# Patient Record
Sex: Male | Born: 2000 | Race: White | Hispanic: No | Marital: Single | Smoking: Never smoker
Health system: Southern US, Community
[De-identification: ages and names within clinical notes are randomized; demographics above are authoritative.]

## PROBLEM LIST (undated history)

## (undated) DIAGNOSIS — R55 Syncope and collapse: Secondary | ICD-10-CM

## (undated) DIAGNOSIS — F419 Anxiety disorder, unspecified: Secondary | ICD-10-CM

## (undated) DIAGNOSIS — I471 Supraventricular tachycardia: Secondary | ICD-10-CM

## (undated) DIAGNOSIS — F909 Attention-deficit hyperactivity disorder, unspecified type: Secondary | ICD-10-CM

## (undated) HISTORY — DX: Attention-deficit hyperactivity disorder, unspecified type: F90.9

## (undated) HISTORY — DX: Supraventricular tachycardia: I47.1

## (undated) HISTORY — PX: NO PAST SURGERIES: SHX2092

## (undated) HISTORY — DX: Syncope and collapse: R55

## (undated) HISTORY — DX: Anxiety disorder, unspecified: F41.9

---
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
## 2018-10-18 ENCOUNTER — Ambulatory Visit: Payer: Self-pay | Admitting: Cardiology

## 2018-11-12 ENCOUNTER — Ambulatory Visit: Payer: BC Managed Care – PPO | Admitting: Cardiology

## 2018-11-12 ENCOUNTER — Encounter: Payer: Self-pay | Admitting: Cardiology

## 2018-11-12 ENCOUNTER — Other Ambulatory Visit: Payer: Self-pay

## 2018-11-12 ENCOUNTER — Ambulatory Visit: Payer: BC Managed Care – PPO

## 2018-11-12 VITALS — BP 122/75 | HR 97 | Temp 97.3°F | Ht 74.0 in | Wt 155.5 lb

## 2018-11-12 DIAGNOSIS — R12 Heartburn: Secondary | ICD-10-CM | POA: Diagnosis not present

## 2018-11-12 DIAGNOSIS — R079 Chest pain, unspecified: Secondary | ICD-10-CM | POA: Insufficient documentation

## 2018-11-12 DIAGNOSIS — R55 Syncope and collapse: Secondary | ICD-10-CM | POA: Diagnosis not present

## 2018-11-12 DIAGNOSIS — R0789 Other chest pain: Secondary | ICD-10-CM | POA: Diagnosis not present

## 2018-11-12 DIAGNOSIS — R002 Palpitations: Secondary | ICD-10-CM | POA: Diagnosis not present

## 2018-11-12 NOTE — Progress Notes (Signed)
Subjective:  Primary Physician:  Eartha InchBadger, Michael C, MD  Patient ID: Cody Pacheco, male    DOB: 09/17/2000, 18 y.o.   MRN: [id number]  Chief Complaint  Patient presents with  . Near Syncope  . New Patient (Initial Visit)    HPI: Cody BeckwithSpencer Pacheco  is a 18 y.o. male. Patient complains of feeling burning type pain in the epigastrium and lower substernal region after he plays basketball or other heavy exertion like weight lifting etc.  The pain lasts for a few minutes but subsequently he has nausea for one to 2 days. Patient has had these symptoms for many years but frequency has increased recently.  There is no radiation of the pain to the arm,  neck or back of the chest.   After about one hour of these symptoms, he starts feeling dizziness and has feeling of near syncope off and on.  Patient says that at that time he is usually not doing anything.  He had passed out once for few seconds when he was walking out of a restaurant.  He has felt skipping in the heart occasionally.  No history of any spells of sudden fast heartbeat or irregular heartbeat.  Patient gives history of heartburn after eating greasy foods etc. He denies acid reflux. He takes Tums as needed.  No complaints of shortness of breath, orthopnea or PND.  No history of swelling on the legs.  No history of diabetes mellitus, hypertension.  His cholesterol has never been checked.  He does not smoke. His grandfather had an MI in his 7260s.  There is no family history of sudden cardiac death. He usually drinks one can of soda and occasionally tea.  No history of taking decongestants.  Patient has history of ADD and has been on amphetamine therapy since childhood.  No history of thyroid problems.  Past Medical History:  Diagnosis Date  . Near syncope     Past Surgical History:  Procedure Laterality Date  . None      Social History   Socioeconomic History  . Marital status: Single    Spouse name: Not on file  . Number of  children: 0  . Years of education: Not on file  . Highest education level: Not on file  Occupational History  . Not on file  Social Needs  . Financial resource strain: Not on file  . Food insecurity    Worry: Not on file    Inability: Not on file  . Transportation needs    Medical: Not on file    Non-medical: Not on file  Tobacco Use  . Smoking status: Never Smoker  . Smokeless tobacco: Never Used  . Tobacco comment: marijuana  Substance and Sexual Activity  . Alcohol use: Not Currently  . Drug use: Not on file  . Sexual activity: Not on file  Lifestyle  . Physical activity    Days per week: Not on file    Minutes per session: Not on file  . Stress: Not on file  Relationships  . Social Musicianconnections    Talks on phone: Not on file    Gets together: Not on file    Attends religious service: Not on file    Active member of club or organization: Not on file    Attends meetings of clubs or organizations: Not on file    Relationship status: Not on file  . Intimate partner violence    Fear of current or ex partner: Not on file  Emotionally abused: Not on file    Physically abused: Not on file    Forced sexual activity: Not on file  Other Topics Concern  . Not on file  Social History Narrative  . Not on file    Current Outpatient Medications on File Prior to Visit  Medication Sig Dispense Refill  . MYDAYIS 37.5 MG CP24 daily.     No current facility-administered medications on file prior to visit.     Review of Systems  Constitutional: Negative for fever.  HENT: Negative for nosebleeds.   Eyes: Negative for blurred vision.  Respiratory: Negative for cough and shortness of breath.   Cardiovascular: Positive for chest pain (heartburn like pain) and palpitations. Negative for orthopnea and leg swelling.  Gastrointestinal: Positive for heartburn and nausea. Negative for abdominal pain and vomiting.  Genitourinary: Negative for dysuria.  Musculoskeletal: Negative for  myalgias.  Skin: Negative for itching and rash.  Neurological: Positive for dizziness (and near syncope). Negative for seizures and loss of consciousness.  Psychiatric/Behavioral: The patient is not nervous/anxious.        Objective:  Blood pressure 122/75, pulse 97, temperature (!) 97.3 F (36.3 C), height 6\' 2"  (1.88 m), weight 155 lb 8 oz (70.5 kg), SpO2 100 %. Body mass index is 19.97 kg/m. No significant orthostatic blood pressure changes.  Physical Exam  Constitutional: He is oriented to person, place, and time. He appears well-developed and well-nourished.  HENT:  Head: Normocephalic and atraumatic.  Eyes: Conjunctivae are normal.  Neck: No thyromegaly present.  Cardiovascular: Normal rate and regular rhythm. Exam reveals no gallop.  No murmur heard. Pulses:      Carotid pulses are 2+ on the right side and 2+ on the left side.      Femoral pulses are 2+ on the right side and 2+ on the left side.      Dorsalis pedis pulses are 2+ on the right side and 2+ on the left side.       Posterior tibial pulses are 2+ on the right side and 2+ on the left side.  Pulmonary/Chest: He has no wheezes. He has no rales.  Abdominal: He exhibits no mass. There is no abdominal tenderness.  Musculoskeletal:        General: No edema.  Lymphadenopathy:    He has no cervical adenopathy.  Neurological: He is alert and oriented to person, place, and time.  Skin: Skin is warm.   CARDIAC STUDIES:   Assessment & Recommendations:   1. Near syncope - EKG 12-Lead- Sinus  Rhythm  WITHIN NORMAL LIMITS. Normal QTc  2. Chest pain of uncertain etiology  3. Palpitation  4. Heartburn  Laboratory Exam:  No flowsheet data found. No flowsheet data found. Lipid Panel  No results found for: CHOL, TRIG, HDL, CHOLHDL, VLDL, LDLCALC, LDLDIRECT   Recommendation:  I have discussed my assessment with the patient.  He has symptoms of burning type pain in the epigastrium and lower substernal region on  exertion.  Subsequently he gets nausea (usually after 1 hour or more), he has dizziness and near syncope. The dizziness and near syncope happens at rest.  Exact etiology of these symptoms is unclear.  We will do cardiac event monitoring to assess for arrhythmia.  He will have echocardiogram to assess for any structural heart disease and treadmill stress test to check for exercise induced arrhythmias.  I have advised him to take Prilosec 20 mg daily for the symptoms of heartburn.  He will return for follow-up  after the above tests.  Despina Hick, MD, [facility] 11/12/2018, 12:37 PM [facility]. [place] Pager: [phone] Office: [phone] If no answer Cell [phone]

## 2018-11-14 ENCOUNTER — Encounter: Payer: Self-pay | Admitting: Cardiology

## 2018-11-14 NOTE — Progress Notes (Signed)
Serious event report 11/12/2018: SVT at 215 bpm lasting for 1 min. No reported symptoms. Will continue to monitor.

## 2018-11-27 ENCOUNTER — Other Ambulatory Visit: Payer: Self-pay

## 2018-11-27 ENCOUNTER — Ambulatory Visit (INDEPENDENT_AMBULATORY_CARE_PROVIDER_SITE_OTHER): Payer: BC Managed Care – PPO

## 2018-11-27 DIAGNOSIS — R0789 Other chest pain: Secondary | ICD-10-CM

## 2018-12-02 ENCOUNTER — Ambulatory Visit (INDEPENDENT_AMBULATORY_CARE_PROVIDER_SITE_OTHER): Payer: BC Managed Care – PPO

## 2018-12-02 DIAGNOSIS — R55 Syncope and collapse: Secondary | ICD-10-CM

## 2019-01-10 ENCOUNTER — Encounter: Payer: Self-pay | Admitting: Cardiology

## 2019-01-13 ENCOUNTER — Other Ambulatory Visit: Payer: Self-pay

## 2019-01-13 ENCOUNTER — Encounter: Payer: Self-pay | Admitting: Cardiology

## 2019-01-13 ENCOUNTER — Telehealth: Payer: Self-pay

## 2019-01-13 ENCOUNTER — Ambulatory Visit (INDEPENDENT_AMBULATORY_CARE_PROVIDER_SITE_OTHER): Payer: BC Managed Care – PPO | Admitting: Cardiology

## 2019-01-13 VITALS — BP 142/82 | HR 64 | Temp 96.0°F | Ht 74.0 in | Wt 158.0 lb

## 2019-01-13 DIAGNOSIS — I471 Supraventricular tachycardia: Secondary | ICD-10-CM | POA: Diagnosis not present

## 2019-01-13 DIAGNOSIS — R110 Nausea: Secondary | ICD-10-CM

## 2019-01-13 DIAGNOSIS — R55 Syncope and collapse: Secondary | ICD-10-CM

## 2019-01-13 NOTE — Telephone Encounter (Signed)
Pt's mother called [phone] I told her you would call her after office hours.

## 2019-01-13 NOTE — Progress Notes (Signed)
Primary Physician:  Eartha Inch, MD   Patient ID: Cody Pacheco, male    DOB: 30-Oct-2000, 18 y.o.   MRN: [id number]  Subjective:    Chief Complaint  Patient presents with  . Loss of Consciousness  . Follow-up    2 month  . Results    monitor    HPI: Cody Pacheco  is a 18 y.o. male  with history of ADD, recently evaluated by Dr. Sherril Croon for evaluation of palpitations, dizziness, and exertional dizziness. He underwent 30 day event monitor, echocardiogram, and treadmill stress test and now presents for follow up.  Patient has had symptoms for several years; however, due to increased frequency of his symptoms prompted evaluation by [place]. Patient reports symptoms of nausea after heavy exertion that is now happening 2-3 times a week. No vomiting, he previously had indigestion symptoms, but he states that this improved with PPI.   He has occasional episodes of sudden dizziness at rest, approximately 2 times a month. He occasionally notices palpitations with this, but not all the time. Has the feeling that he may pass out. Generally can last for a few minutes. He does report one episode of syncope that occurred while walking out of a restaurant. LOC was only for a few seconds.   No complaints of shortness of breath, orthopnea or PND.  No history of swelling on the legs.  No history of diabetes mellitus, hypertension.  His cholesterol has never been checked.  He does not smoke. His grandfather had an MI in his 7s.  There is no family history of sudden cardiac death. He usually drinks one can of soda and occasionally tea.  No history of taking decongestants.  Patient has history of ADD and has been on amphetamine therapy since childhood.  No history of thyroid problems  Past Medical History:  Diagnosis Date  . Near syncope     Past Surgical History:  Procedure Laterality Date  . None      Social History   Socioeconomic History  . Marital status: Single    Spouse name: Not  on file  . Number of children: 0  . Years of education: Not on file  . Highest education level: Not on file  Occupational History  . Not on file  Social Needs  . Financial resource strain: Not on file  . Food insecurity    Worry: Not on file    Inability: Not on file  . Transportation needs    Medical: Not on file    Non-medical: Not on file  Tobacco Use  . Smoking status: Never Smoker  . Smokeless tobacco: Never Used  . Tobacco comment: marijuana  Substance and Sexual Activity  . Alcohol use: Not Currently  . Drug use: Not Currently  . Sexual activity: Not on file  Lifestyle  . Physical activity    Days per week: Not on file    Minutes per session: Not on file  . Stress: Not on file  Relationships  . Social Musician on phone: Not on file    Gets together: Not on file    Attends religious service: Not on file    Active member of club or organization: Not on file    Attends meetings of clubs or organizations: Not on file    Relationship status: Not on file  . Intimate partner violence    Fear of current or ex partner: Not on file    Emotionally abused: Not on  file    Physically abused: Not on file    Forced sexual activity: Not on file  Other Topics Concern  . Not on file  Social History Narrative  . Not on file    Review of Systems  Constitution: Negative for decreased appetite, malaise/fatigue, weight gain and weight loss.  Eyes: Negative for visual disturbance.  Cardiovascular: Positive for chest pain and near-syncope. Negative for claudication, dyspnea on exertion, leg swelling, orthopnea, palpitations and syncope.  Respiratory: Negative for hemoptysis and wheezing.   Endocrine: Negative for cold intolerance and heat intolerance.  Hematologic/Lymphatic: Does not bruise/bleed easily.  Skin: Negative for nail changes.  Musculoskeletal: Negative for muscle weakness and myalgias.  Gastrointestinal: Negative for abdominal pain, change in bowel habit,  heartburn, nausea and vomiting.  Neurological: Positive for dizziness. Negative for difficulty with concentration, focal weakness and headaches.  Psychiatric/Behavioral: Negative for altered mental status and suicidal ideas.  All other systems reviewed and are negative.     Objective:  Blood pressure (!) 142/82, pulse 64, temperature (!) 96 F (35.6 C), height 6\' 2"  (1.88 m), weight 158 lb (71.7 kg), SpO2 97 %. Body mass index is 20.29 kg/m.    Physical Exam  Constitutional: He is oriented to person, place, and time. Vital signs are normal. He appears well-developed and well-nourished.  HENT:  Head: Normocephalic and atraumatic.  Neck: Normal range of motion.  Cardiovascular: Normal rate, regular rhythm, normal heart sounds and intact distal pulses.  Pulses:      Femoral pulses are 2+ on the right side and 2+ on the left side.      Popliteal pulses are 2+ on the right side and 2+ on the left side.       Dorsalis pedis pulses are 2+ on the right side and 2+ on the left side.       Posterior tibial pulses are 2+ on the right side and 2+ on the left side.  Pulmonary/Chest: Effort normal and breath sounds normal. No accessory muscle usage. No respiratory distress.  Abdominal: Soft. Bowel sounds are normal.  Musculoskeletal: Normal range of motion.  Neurological: He is alert and oriented to person, place, and time.  Skin: Skin is warm and dry.  Vitals reviewed.  Radiology: No results found.  Laboratory examination:    No flowsheet data found. No flowsheet data found. Lipid Panel  No results found for: CHOL, TRIG, HDL, CHOLHDL, VLDL, LDLCALC, LDLDIRECT HEMOGLOBIN A1C No results found for: HGBA1C, MPG TSH No results for input(s): TSH in the last 8760 hours.  PRN Meds:. There are no discontinued medications. Current Meds  Medication Sig  . MYDAYIS 37.5 MG CP24 daily.    Cardiac Studies:   Echocardiogram 12/02/2018: Normal left ventricular wall thickness. Left ventricle  cavity is normal in size. Normal LV systolic function with visual EF 50-55%. Normal global wall motion. Normal diastolic filling pattern.  Left atrial cavity is mildly dilated. No significant valvular abnormality.   Treadmill exercise stress test 11/27/2018:  Indication: Chest pain Resting EKG demonstrated normal sinus rhythm.  Stress EKG is negative for myocardial ischemia. The patient exercised on Bruce protocol for 13:14 min. Patient achieved 14.32 METS and reached HR 162 bpm, which is 81 % of maximum age-predicted HR. Stress test terminated due to FATIGUE. Normal BP response, No ST-T changes of ischemia. Excellent effort.  No significant arrhythmias.  Impression: No inducible arrhythmias at 81% of MPHR, submaximal heart rate achieved.  However excellent exercise tolerance with no inducible arrhythmias or ST-T wave  changes of ischemia.  Clinical correlation recommended.  30-day event monitor 8/25-9/23/2020: Normal sinus rhythm.  One auto detected event on day 2 at 1733 for SVT at 215 bpm that lasted for 1 minute.  No reported symptoms.  Patient also had several auto detected episodes of sinus tachycardia without reported symptoms that occurred during exercise.  Assessment:   Paroxysmal SVT (supraventricular tachycardia) (HCC) - Plan: Ambulatory referral to Cardiac Electrophysiology  Nausea  Near syncope   Recommendations:   I have discussed recently obtained test results with the patient. He was noted to have 1 episode of brief SVT on event monitor. No structural abnormalities noted on echo. Stress testing was without inducible arrhythmias. Lengthy discussion with the patient providing explanation of SVT and treatment options including conservative measures, medication, and ablation. Given his young age, he would also potentially be a good candidate for ablation. I would like to get an opinion from EP regarding management. As his episode was very brief, feel that we can try conservative  measures first at least until further evaluated. Valsalva maneuvers were explained.   He does have episodes of exertional nausea, but are occurring several times a week. This did not seem to correlate with SVT. He may benefit from GI evaluation for his symptoms. I will plan to see him back in 3 months for follow up on PSVT.  Test result findings along with plan of care was discussed with patients mother, Ebony Hail, over the phone. She verbalized agreeance and understanding of the plan.   Miquel Dunn, MSN, APRN, FNP-C [facility]. [place] Office: [phone] Fax: [phone]

## 2019-01-13 NOTE — Patient Instructions (Signed)
Supraventricular Tachycardia, Adult Supraventricular tachycardia (SVT) is a kind of abnormal heartbeat. It makes your heart beat very fast and then beat at a normal speed. A normal resting heartbeat is 60-100 times a minute. This condition can make your heart beat more than 150 times a minute. Times of having a fast heartbeat (episodes) can be scary, but they are usually not dangerous. In some cases, they may lead to heart failure if:  They happen many times per day.  Last longer than a few seconds. What are the causes?  A normal heartbeat starts when an area called the sinoatrial node sends out an electrical signal. In SVT, other areas of the heart send out signals that get in the way of the signal from the sinoatrial node. What increases the risk? You are more likely to develop this condition if you are:  12-30 years old.  A woman. The following factors may make you more likely to develop this condition:  Stress.  Tiredness.  Smoking.  Stimulant drugs, such as cocaine and methamphetamine.  Alcohol.  Caffeine.  Pregnancy.  Feeling worried or nervous (anxiety). What are the signs or symptoms?  A pounding heart.  A feeling that your heart is skipping beats (palpitations).  Weakness.  Trouble getting enough air.  Pain or tightness in your chest.  Feeling like you are going to pass out (faint).  Feeling worried or nervous.  Dizziness.  Sweating.  Feeling sick to your stomach (nausea).  Passing out.  Tiredness. Sometimes, there are no symptoms. How is this treated?  Vagal nerve stimulation. Ways to do this include: ? Holding your breath and pushing, as though you are pooping (having a bowel movement). ? Massaging an area on one side of your neck. Do not try this yourself. Only a doctor should do this. If done the wrong way, it can lead to a stroke. ? Bending forward with your head between your legs. ? Coughing while bending forward with your head between  your legs. ? Closing your eyes and massaging your eyeballs. Ask a doctor how to do this.  Medicines that prevent attacks.  Medicine to stop an attack given through an IV tube at the hospital.  A small electric shock (cardioversion) that stops an attack.  Radiofrequency ablation. In this procedure, a small, thin tube (catheter) is used to send energy to the area that is causing the rapid heartbeats. If you do not have symptoms, you may not need treatment. Follow these instructions at home: Stress  Avoid things that make you feel stressed.  To deal with stress, try: ? Doing yoga or meditation, or being out in nature. ? Listening to relaxing music. ? Doing deep breathing. ? Taking steps to be healthy, such as getting lots of sleep, exercising, and eating a balanced diet. ? Talking with a mental health doctor. Lifestyle   Try to get at least 7 hours of sleep each night.  Do not use any products that contain nicotine or tobacco, such as cigarettes, e-cigarettes, and chewing tobacco. If you need help quitting, ask your doctor.  Be aware of how alcohol affects you. ? If alcohol gives you a fast heartbeat, do not drink alcohol. ? If alcohol does not seem to give you a fast heartbeat, limit alcohol use to no more than 1 drink a day for women who are not pregnant, and 2 drinks a day for men. In the [place] one drink is one of these: ? 12 oz of beer (355 mL). ? 5   oz of wine (148 mL). ? 1 oz of hard liquor (44 mL).  Be aware of how caffeine affects you. ? If caffeine gives you a fast heartbeat, do not eat, drink, or use anything with caffeine in it. ? If caffeine does not seem to give you a fast heartbeat, limit how much caffeine you eat, drink, or use.  Do not use stimulant drugs. If you need help quitting, ask your doctor. General instructions  Stay at a healthy weight.  Exercise regularly. Ask your doctor about good activities for you. Try one or a mixture of these: ? 150 minutes  a week of gentle exercise, like walking or yoga. ? 75 minutes a week of exercise that is very active, like running or swimming.  Do vagus nerve treatments to slow down your heartbeat as told by your doctor.  Take over-the-counter and prescription medicines only as told by your doctor.  Keep all follow-up visits as told by your doctor. This is important. Contact a doctor if:  You have a fast heartbeat more often.  Times of having a fast heartbeat last longer than before.  Home treatments to slow down your heartbeat do not help.  You have new symptoms. Get help right away if:  You have chest pain.  Your symptoms get worse.  You have trouble breathing.  Your heart beats very fast for more than 20 minutes.  You pass out. These symptoms may be an emergency. Do not wait to see if the symptoms will go away. Get medical help right away. Call your local emergency services (911 in the [place] Do not drive yourself to the hospital. Summary  SVT is a type of abnormal heart beat.  This condition can make your heart beat more than 150 times a minute.  Treatment depends on how often the condition happens and your symptoms. This information is not intended to replace advice given to you by your health care provider. Make sure you discuss any questions you have with your health care provider. Document Released: 03/06/2005 Document Revised: 01/22/2018 Document Reviewed: 01/22/2018 Elsevier Patient Education  2020 [employer]  

## 2019-01-24 ENCOUNTER — Encounter: Payer: Self-pay | Admitting: Internal Medicine

## 2019-01-28 ENCOUNTER — Encounter: Payer: Self-pay | Admitting: Family Medicine

## 2019-02-07 ENCOUNTER — Encounter: Payer: Self-pay | Admitting: Gastroenterology

## 2019-03-20 ENCOUNTER — Ambulatory Visit: Payer: BC Managed Care – PPO | Admitting: Gastroenterology

## 2019-03-20 ENCOUNTER — Encounter: Payer: Self-pay | Admitting: Gastroenterology

## 2019-03-20 ENCOUNTER — Other Ambulatory Visit (INDEPENDENT_AMBULATORY_CARE_PROVIDER_SITE_OTHER): Payer: BC Managed Care – PPO

## 2019-03-20 VITALS — BP 128/70 | HR 82 | Temp 98.6°F | Ht 74.0 in | Wt 163.0 lb

## 2019-03-20 DIAGNOSIS — K219 Gastro-esophageal reflux disease without esophagitis: Secondary | ICD-10-CM

## 2019-03-20 DIAGNOSIS — R110 Nausea: Secondary | ICD-10-CM

## 2019-03-20 DIAGNOSIS — I471 Supraventricular tachycardia: Secondary | ICD-10-CM

## 2019-03-20 LAB — COMPREHENSIVE METABOLIC PANEL
ALT: 26 U/L (ref 0–53)
AST: 28 U/L (ref 0–37)
Albumin: 4.5 g/dL (ref 3.5–5.2)
Alkaline Phosphatase: 73 U/L (ref 39–117)
BUN: 18 mg/dL (ref 6–23)
CO2: 31 mEq/L (ref 19–32)
Calcium: 9.5 mg/dL (ref 8.4–10.5)
Chloride: 101 mEq/L (ref 96–112)
Creatinine, Ser: 0.8 mg/dL (ref 0.40–1.50)
GFR: 122.74 mL/min (ref >60.00)
Glucose, Bld: 113 mg/dL — ABNORMAL HIGH (ref 70–99)
Potassium: 4.1 mEq/L (ref 3.5–5.1)
Sodium: 140 mEq/L (ref 135–145)
Total Bilirubin: 0.5 mg/dL (ref 0.2–1.2)
Total Protein: 7.2 g/dL (ref 6.0–8.3)

## 2019-03-20 LAB — CBC WITH DIFFERENTIAL/PLATELET
Basophils Absolute: 0 10*3/uL (ref 0.0–0.1)
Basophils Relative: 0.9 % (ref 0.0–3.0)
Eosinophils Absolute: 0.1 10*3/uL (ref 0.0–0.7)
Eosinophils Relative: 1.5 % (ref 0.0–5.0)
HCT: 44.1 % (ref 39.0–52.0)
Hemoglobin: 15.3 g/dL (ref 13.0–17.0)
Lymphocytes Relative: 47.8 % — ABNORMAL HIGH (ref 12.0–46.0)
Lymphs Abs: 2.2 10*3/uL (ref 0.7–4.0)
MCHC: 34.7 g/dL (ref 30.0–36.0)
MCV: 83.3 fl (ref 78.0–100.0)
Monocytes Absolute: 0.4 10*3/uL (ref 0.1–1.0)
Monocytes Relative: 8.3 % (ref 3.0–12.0)
Neutro Abs: 1.9 10*3/uL (ref 1.4–7.7)
Neutrophils Relative %: 41.5 % — ABNORMAL LOW (ref 43.0–77.0)
Platelets: 246 10*3/uL (ref 150.0–400.0)
RBC: 5.29 Mil/uL (ref 4.22–5.81)
RDW: 13.5 % (ref 11.5–14.6)
WBC: 4.5 10*3/uL (ref 4.5–10.5)

## 2019-03-20 LAB — H. PYLORI ANTIBODY, IGG: H Pylori IgG: NEGATIVE

## 2019-03-20 LAB — TSH: TSH: 1.3 u[IU]/mL (ref 0.35–5.50)

## 2019-03-20 MED ORDER — ONDANSETRON HCL 4 MG PO TABS: ORAL_TABLET | ORAL | 3 refills | Status: AC

## 2019-03-20 NOTE — Progress Notes (Signed)
HPI :  18 year old male with a history of ADHD, anxiety, syncope with history of PSVT, referred by Anastasia Pall for nausea.  He endorses symptoms of nausea that have bothered him for at least the past 8 years.  His symptoms are very specifically related to any exertional activity.  When he exerts himself to any moderate capacity he reliably will have nausea and his abdomen will not feel good for up to a few hours at a time.  He likes to play basketball a lot and this is where it usually bothers him the most, also is precipitated fairly quickly if he lifts weights.  He has also experienced this when playing golf on a hot day, or other times where he needs to exert himself.  If he is not exerting himself, he has no nausea at all.  He denies any problems eating.  No nausea, vomiting, abdominal pain after he eats in any way.  He does have some heartburn and pyrosis which has bothered him at times.  He was started on Prilosec 20 mg a day since October and this has reliably taken away his reflux but has not made any difference in his nausea.  He denies any dysphagia.  He denies any diarrhea or constipation.  No blood in his stools.  He states he tries to hydrate before he exercises which does not seem to minimize this occurrence.  He states if he eats prior to exerting himself the symptoms are much worse and he tries to avoid doing this.  He states he previously had a lot of discomfort when this used to happen, although he does not have much discomfort recently.  In general he feels poorly for a few hours after exerting himself.  He uses Advil about once a week for sporadic headache, does not use it routinely.  He does smoke marijuana frequently for the past year as he states this is what has provided him the most benefit for his symptoms more than anything else.  This tends to slightly decrease the nausea when he exerts himself however it does not resolve it in any way.  He is adamant he is only been using  marijuana for the past year, his symptoms have predated its use by at least 7 years or so.    He denies any prior cross-sectional imaging or endoscopic work-up.  He has been seen by the cardiologist for palpitations and was noted to have PSVT.  He was considering ablative therapy but is holding off on that for now.  He does not think that his nausea is related to the PSVT based on the work-up he had done so far.  He has had an echocardiogram which looked okay.  He's not had any lab work done recently.    Past Medical History:  Diagnosis Date  . ADHD   . Anxiety   . Near syncope   . PSVT (paroxysmal supraventricular tachycardia) ([place])      Past Surgical History:  Procedure Laterality Date  . NO PAST SURGERIES     Family History  Problem Relation Age of Onset  . Heart attack Maternal Grandfather    Social History   Tobacco Use  . Smoking status: Never Smoker  . Smokeless tobacco: Never Used  . Tobacco comment: marijuana  Substance Use Topics  . Alcohol use: Not Currently  . Drug use: Not Currently   Current Outpatient Medications  Medication Sig Dispense Refill  . MYDAYIS 37.5 MG CP24 daily.    Marland Kitchen  omeprazole (PRILOSEC) 20 MG capsule Take 20 mg by mouth daily.    . sertraline (ZOLOFT) 50 MG tablet Take 50 mg by mouth daily.    . ondansetron (ZOFRAN) 4 MG tablet Take one tablet 30 minutes prior to exercise. 30 tablet 3   No current facility-administered medications for this visit.   No Known Allergies   Review of Systems: All systems reviewed and negative except where noted in HPI.     Physical Exam: BP 128/70   Pulse 82   Temp 98.6 F (37 C)   Ht '6\' 2"'  (1.88 m)   Wt 163 lb (73.9 kg)   BMI 20.93 kg/m  Constitutional: Pleasant,well-developed, male in no acute distress. HEENT: Normocephalic and atraumatic. Conjunctivae are normal. No scleral icterus. Neck supple.  Cardiovascular: Normal rate, regular rhythm.  Pulmonary/chest: Effort normal and breath sounds  normal. No wheezing, rales or rhonchi. Abdominal: Soft, nondistended, nontender.  There are no masses palpable. No hepatomegaly. Extremities: no edema Lymphadenopathy: No cervical adenopathy noted. Neurological: Alert and oriented to person place and time. Skin: Skin is warm and dry. No rashes noted. Psychiatric: Normal mood and affect. Behavior is normal.   ASSESSMENT AND PLAN: 18 year old male here for new patient assessment of the following:  Exercise induced nausea - he appears to have exercised induced nausea which is a known phenomena, however his symptoms are sometimes with minimal exertion and bother him reliably and significantly. He will often feel quite poorly for a few hours after he exerts himself. He does not think this is related to PSVT, which is a possibility. As above, in the absence of exertion he has no symptoms at all and feels normal.  Marijuana use only within the past year has reduced symptoms somewhat but he has continued to be bothered by this frequently. I don't think the marijauna is related to his symptoms given the time course. He likes to play a lot of basketball on limits himself due to symptoms.  I discussed the situation with him, and options to further evaluate this.  He has not had any basic labs done and will check CBC, c-Met, and TSH in light of his PSVT.  I will screen him for H. pylori with the IgG serology although this would be unusual to cause the symptoms.  I offered him a CT angio of the abdomen and pelvis to assess his vasculature to ensure no stenosis or vascular abnormality that could account for some of the symptoms, as he has had abdominal discomfort associated with this.  We will proceed with that test first, and await his labs.  I think the yield of EGD is probably low right now but can consider it if CT is unremarkable and his symptoms persist.  I will give him some Zofran to take half an hour to an hour prior to exercising and see if that helps at  all.  Further recommendations pending the results of his exam and work-up. He will continue prilosec in the interim for his GERD.  GERD - continue with trial of prilosec  PSVT - as above, following with cardiology, they do not think related to his nausea  Millersport Cellar, MD [facility]  CC: Chesley Noon, MD

## 2019-03-20 NOTE — Patient Instructions (Addendum)
If you are age 20 or older, your body mass index should be between 23-30. Your Body mass index is 20.93 kg/m. If this is out of the aforementioned range listed, please consider follow up with your Primary Care Provider.  If you are age 49 or younger, your body mass index should be between 19-25. Your Body mass index is 20.93 kg/m. If this is out of the aformentioned range listed, please consider follow up with your Primary Care Provider.   Your provider has requested that you go to the basement level for lab work before leaving today. Press "B" on the elevator. The lab is located at the first door on the left as you exit the elevator.  Continue Prilosec 20 mg daily  Zofran 4 mg ODT 30 minutes prior to exercise.  You have been scheduled for a CT scan of the abdomen and pelvis at [facility] are scheduled on 03/27/19 at 8:30am. You should arrive 15 minutes prior to your appointment time for registration. Please follow the written instructions below on the day of your exam:  WARNING: IF YOU ARE ALLERGIC TO IODINE/X-RAY DYE, PLEASE NOTIFY RADIOLOGY IMMEDIATELY AT [phone]! YOU WILL BE GIVEN A 13 HOUR PREMEDICATION PREP.  1) Do not eat or drink anything after 4:30am (4 hours prior to your test)    If you have any questions regarding your exam or if you need to reschedule, you may call the CT department at [phone] between the hours of 8:00 am and 5:00 pm, Monday-Friday.  _________________________________________________________________

## 2019-03-27 ENCOUNTER — Other Ambulatory Visit: Payer: Self-pay

## 2019-03-27 ENCOUNTER — Encounter (HOSPITAL_COMMUNITY): Payer: Self-pay

## 2019-03-27 ENCOUNTER — Ambulatory Visit (HOSPITAL_COMMUNITY)
Admission: RE | Admit: 2019-03-27 | Discharge: 2019-03-27 | Disposition: A | Discharge status: Home / Self Care | Payer: BC Managed Care – PPO | Source: Ambulatory Visit | Attending: Gastroenterology | Admitting: Gastroenterology

## 2019-03-27 DIAGNOSIS — R110 Nausea: Secondary | ICD-10-CM

## 2019-03-27 DIAGNOSIS — I471 Supraventricular tachycardia: Secondary | ICD-10-CM | POA: Diagnosis present

## 2019-03-27 DIAGNOSIS — K219 Gastro-esophageal reflux disease without esophagitis: Secondary | ICD-10-CM | POA: Diagnosis present

## 2019-03-27 MED ORDER — IOHEXOL 350 MG/ML SOLN
100.0000 mL | Freq: Once | INTRAVENOUS | Status: AC | PRN
  Administered 2019-03-27: 100 mL via INTRAVENOUS

## 2019-03-27 MED ORDER — SODIUM CHLORIDE (PF) 0.9 % IJ SOLN
INTRAMUSCULAR | Status: AC
  Filled 2019-03-27: qty 50

## 2019-04-03 ENCOUNTER — Other Ambulatory Visit: Payer: Self-pay

## 2019-04-03 DIAGNOSIS — Z01818 Encounter for other preprocedural examination: Secondary | ICD-10-CM

## 2019-04-03 DIAGNOSIS — R110 Nausea: Secondary | ICD-10-CM

## 2019-04-09 ENCOUNTER — Encounter: Payer: BC Managed Care – PPO | Admitting: Gastroenterology

## 2019-04-11 ENCOUNTER — Encounter: Payer: Self-pay | Admitting: Gastroenterology

## 2019-04-11 ENCOUNTER — Other Ambulatory Visit: Payer: Self-pay | Admitting: Gastroenterology

## 2019-04-11 ENCOUNTER — Ambulatory Visit (INDEPENDENT_AMBULATORY_CARE_PROVIDER_SITE_OTHER): Payer: BC Managed Care – PPO

## 2019-04-11 DIAGNOSIS — Z1159 Encounter for screening for other viral diseases: Secondary | ICD-10-CM

## 2019-04-14 LAB — SARS CORONAVIRUS 2 (TAT 6-24 HRS): SARS Coronavirus 2: NEGATIVE

## 2019-04-15 ENCOUNTER — Ambulatory Visit (AMBULATORY_SURGERY_CENTER): Payer: BC Managed Care – PPO | Admitting: Gastroenterology

## 2019-04-15 ENCOUNTER — Other Ambulatory Visit: Payer: Self-pay

## 2019-04-15 ENCOUNTER — Encounter: Payer: Self-pay | Admitting: Gastroenterology

## 2019-04-15 ENCOUNTER — Encounter: Payer: BC Managed Care – PPO | Admitting: Gastroenterology

## 2019-04-15 VITALS — BP 106/58 | HR 59 | Temp 96.9°F | Resp 18 | Ht 74.0 in | Wt 163.0 lb

## 2019-04-15 DIAGNOSIS — K3189 Other diseases of stomach and duodenum: Secondary | ICD-10-CM

## 2019-04-15 DIAGNOSIS — K317 Polyp of stomach and duodenum: Secondary | ICD-10-CM

## 2019-04-15 DIAGNOSIS — K449 Diaphragmatic hernia without obstruction or gangrene: Secondary | ICD-10-CM

## 2019-04-15 DIAGNOSIS — K2100 Gastro-esophageal reflux disease with esophagitis, without bleeding: Secondary | ICD-10-CM

## 2019-04-15 DIAGNOSIS — R110 Nausea: Secondary | ICD-10-CM

## 2019-04-15 MED ORDER — SODIUM CHLORIDE 0.9 % IV SOLN: 500.0000 mL | Freq: Once | INTRAVENOUS | Status: DC

## 2019-04-15 NOTE — Progress Notes (Signed)
Temp-LC VS-CW  Pt's states no medical or surgical changes since previsit or office visit.  

## 2019-04-15 NOTE — Patient Instructions (Signed)
YOU HAD AN ENDOSCOPIC PROCEDURE TODAY AT THE [facility]:   Refer to the procedure report that was given to you for any specific questions about what was found during the examination.  If the procedure report does not answer your questions, please call your gastroenterologist to clarify.  If you requested that your care partner not be given the details of your procedure findings, then the procedure report has been included in a sealed envelope for you to review at your convenience later.  YOU SHOULD EXPECT: Some feelings of bloating in the abdomen. Passage of more gas than usual.  Walking can help get rid of the air that was put into your GI tract during the procedure and reduce the bloating. If you had a lower endoscopy (such as a colonoscopy or flexible sigmoidoscopy) you may notice spotting of blood in your stool or on the toilet paper. If you underwent a bowel prep for your procedure, you may not have a normal bowel movement for a few days.  Please Note:  You might notice some irritation and congestion in your nose or some drainage.  This is from the oxygen used during your procedure.  There is no need for concern and it should clear up in a day or so.  SYMPTOMS TO REPORT IMMEDIATELY:   Following upper endoscopy (EGD)  Vomiting of blood or coffee ground material  New chest pain or pain under the shoulder blades  Painful or persistently difficult swallowing  New shortness of breath  Fever of 100F or higher  Black, tarry-looking stools  For urgent or emergent issues, a gastroenterologist can be reached at any hour by calling [phone].   DIET:  We do recommend a small meal at first, but then you may proceed to your regular diet.  Drink plenty of fluids but you should avoid alcoholic beverages for 24 hours.  ACTIVITY:  You should plan to take it easy for the rest of today and you should NOT DRIVE or use heavy machinery until tomorrow (because of the sedation medicines used  during the test).    FOLLOW UP: Our staff will call the number listed on your records 48-72 hours following your procedure to check on you and address any questions or concerns that you may have regarding the information given to you following your procedure. If we do not reach you, we will leave a message.  We will attempt to reach you two times.  During this call, we will ask if you have developed any symptoms of COVID 19. If you develop any symptoms (ie: fever, flu-like symptoms, shortness of breath, cough etc.) before then, please call [phone].  If you test positive for Covid 19 in the 2 weeks post procedure, please call and report this information to us.    If any biopsies were taken you will be contacted by phone or by letter within the next 1-3 weeks.  Please call us at [phone] if you have not heard about the biopsies in 3 weeks.    SIGNATURES/CONFIDENTIALITY: You and/or your care partner have signed paperwork which will be entered into your electronic medical record.  These signatures attest to the fact that that the information above on your After Visit Summary has been reviewed and is understood.  Full responsibility of the confidentiality of this discharge information lies with you and/or your care-partner. 

## 2019-04-15 NOTE — Op Note (Signed)
[facility] Patient Name: Cody Pacheco Procedure Date: 04/15/2019 9:59 AM MRN: [id number] Endoscopist: Viviann Spare P. Adela Lank , MD Age: 19 Referring MD:  Date of Birth: [birth year] Gender: Male Account #: [id number] Procedure:                Upper GI endoscopy Indications:              Exertional nausea, limited ability to exercise due                            to nausea. CT angio abdomen / pelvis negative. Medicines:                Monitored Anesthesia Care Procedure:                Pre-Anesthesia Assessment:                           - Prior to the procedure, a History and Physical                            was performed, and patient medications and                            allergies were reviewed. The patient's tolerance of                            previous anesthesia was also reviewed. The risks                            and benefits of the procedure and the sedation                            options and risks were discussed with the patient.                            All questions were answered, and informed consent                            was obtained. Prior Anticoagulants: The patient has                            taken no previous anticoagulant or antiplatelet                            agents. ASA Grade Assessment: II - A patient with                            mild systemic disease. After reviewing the risks                            and benefits, the patient was deemed in                            satisfactory condition to undergo the procedure.  After obtaining informed consent, the endoscope was                            passed under direct vision. Throughout the                            procedure, the patient's blood pressure, pulse, and                            oxygen saturations were monitored continuously. The                            Endoscope was introduced through the mouth, and   advanced to the second part of duodenum. The upper                            GI endoscopy was accomplished without difficulty.                            The patient tolerated the procedure well. Scope In: Scope Out: Findings:                 LA Grade esophagitis was found 40 cm from the                            incisors.                           A single nodule was found at the gastroesophageal                            junction at the site of esophagitis, 40 cm from the                            incisors, suspect inflammatory in etiology.                            Biopsies were taken with a cold forceps for                            histology.                           A small hiatal hernia was present.                           The exam of the esophagus was otherwise normal.                           A single benign appearing 3 mm sessile polyp was                            found in the gastric fundus. The polyp was removed  with a cold biopsy forceps. Resection and retrieval                            were complete.                           The proximal stomach was angulated leading into the                            gastric body, making it challenging to retroflex in                            this area. The exam of the stomach was otherwise                            normal - no inflammatory changes, ulcerations, etc.                           Biopsies were taken with a cold forceps in the                            gastric body, at the incisura and in the gastric                            antrum for Helicobacter pylori testing.                           The duodenal bulb and second portion of the                            duodenum were normal. Biopsies for histology were                            taken with a cold forceps for evaluation of celiac                            disease. Complications:            No immediate complications.  Estimated blood loss:                            Minimal. Estimated Blood Loss:     Estimated blood loss was minimal. Estimated blood                            loss was minimal. Impression:               - Mild esophagitis at the GEJ, with associated                            nodule that is likely inflammatory in etiology.                            Biopsied.                           -  Small hiatal hernia.                           - Normal esophagus otherwise.                           - A single gastric polyp. Resected and retrieved.                           - Angulated turn in the proximal stomach leading                            into gastric body, otherwise normal stomach,                            biopsies taken to rule out H pylori.                           - Normal duodenal bulb and second portion of the                            duodenum. Biopsied. Recommendation:           - Patient has a contact number available for                            emergencies. The signs and symptoms of potential                            delayed complications were discussed with the                            patient. Return to normal activities tomorrow.                            Written discharge instructions were provided to the                            patient.                           - Resume previous diet.                           - Continue present medications.                           - Await pathology results with further                            recommendations                           - Take omeprazole 20mg  daily if not using                            routinely, given esophagitis noted on this  exam. If                            taking daily, would increase to twice daily Viviann SpareSteven P. Jillienne Egner, MD 04/15/2019 10:33:11 AM This report has been signed electronically.

## 2019-04-15 NOTE — Progress Notes (Signed)
Called to room to assist during endoscopic procedure.  Patient ID and intended procedure confirmed with present staff. Received instructions for my participation in the procedure from the performing physician.  

## 2019-04-15 NOTE — Progress Notes (Signed)
PT taken to PACU. Monitors in place. VSS. Report given to RN. 

## 2019-04-17 ENCOUNTER — Telehealth: Payer: Self-pay

## 2019-04-17 NOTE — Telephone Encounter (Signed)
NO ANSWER, MESSAGE LEFT FOR PATIENT. 

## 2019-04-17 NOTE — Telephone Encounter (Signed)
  Follow up Call-  Call back number 04/15/2019  Post procedure Call Back phone  # [phone]  Permission to leave phone message Yes  Some recent data might be hidden     Patient questions:  Do you have a fever, pain , or abdominal swelling? No. Pain Score  0 *  Have you tolerated food without any problems? Yes.    Have you been able to return to your normal activities? Yes.    Do you have any questions about your discharge instructions: Diet   No. Medications  No. Follow up visit  No.  Do you have questions or concerns about your Care? No.  Actions: * If pain score is 4 or above: No action needed, pain <4.   1. Have you developed a fever since your procedure? No  2.   Have you had an respiratory symptoms (SOB or cough) since your procedure? No  3.   Have you tested positive for COVID 19 since your procedure No  4.   Have you had any family members/close contacts diagnosed with the COVID 19 since your procedure?  No   If yes to any of these questions please route to Laverna Peace, RN and Jennye Boroughs, RN.

## 2019-04-18 ENCOUNTER — Ambulatory Visit: Payer: BC Managed Care – PPO | Admitting: Cardiology

## 2019-04-18 ENCOUNTER — Telehealth: Payer: Self-pay

## 2019-04-18 NOTE — Telephone Encounter (Signed)
Left message to please  Call back 

## 2019-05-21 ENCOUNTER — Encounter: Payer: Self-pay | Admitting: Gastroenterology

## 2019-05-21 ENCOUNTER — Other Ambulatory Visit: Payer: Self-pay

## 2019-05-21 ENCOUNTER — Ambulatory Visit: Payer: BC Managed Care – PPO | Admitting: Gastroenterology

## 2019-05-21 VITALS — BP 100/60 | HR 76 | Temp 98.2°F | Ht 74.0 in | Wt 163.8 lb

## 2019-05-21 DIAGNOSIS — R109 Unspecified abdominal pain: Secondary | ICD-10-CM | POA: Diagnosis not present

## 2019-05-21 DIAGNOSIS — R110 Nausea: Secondary | ICD-10-CM

## 2019-05-21 MED ORDER — OMEPRAZOLE 40 MG PO CPDR: 40.0000 mg | DELAYED_RELEASE_CAPSULE | Freq: Every day | ORAL | 0 refills | Status: DC

## 2019-05-21 NOTE — Patient Instructions (Addendum)
If you are age 21 or older, your body mass index should be between 23-30. Your Body mass index is 21.03 kg/m. If this is out of the aforementioned range listed, please consider follow up with your Primary Care Provider.  If you are age 9 or younger, your body mass index should be between 19-25. Your Body mass index is 21.03 kg/m. If this is out of the aformentioned range listed, please consider follow up with your Primary Care Provider.    We have sent the following medications to your pharmacy for you to pick up at your convenience: Omeprazole 40 mg twice daily.     Thank you for entrusting me with your care and for choosing [facility], Dr. Ileene Patrick

## 2019-05-21 NOTE — Progress Notes (Signed)
HPI :  19 y/o male here for a follow up visit for nausea. See note from 03/20/19 for full intake history.  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  If he is not exerting himself he does not have any nausea, however whenever he exerts himself to any moderate capacity he reliably will have nausea in his abdomen will feel uncomfortable and this will last for a few hours after he exerts himself.  Most notably he feels this when he plays basketball, he tries to play bicycle every day while he can, he currently goes to [facility], becoming increasingly frustrated by his symptoms.  He will also feel when he plays golf at times.  We have tried giving him some Zofran before he works out and that has not prevented any symptoms.  After our initial visit we obtain some baseline labs which were unrevealing.  We then obtained a CT angiogram to ensure no vascular impingement or problems there, this study was entirely normal.  He underwent an endoscopy with me January 26 which showed some reflux esophagitis, mild, otherwise no clear abnormalities and biopsies were negative for H. Pylori.  I had recommended some omeprazole 20 mg a day previously and this is resolved the heartburn he has, as he frequently has had some reflux symptoms with his nausea, however the nausea has not been improved at all with this.  Again Zofran does not help.  He tries to smoke marijuana now before he works out and states this does provide some temporary improvement when he does it but symptoms essentially are not prevented any still quite limited.  Again if he does not exert himself at all he has absolutely no symptoms.  He eats well, no abdominal pain after he eats.  No vomiting.  He has maintained his weight.  He has only been smoking marijuana for the past year or 2 and only seems to use this prior to exerting himself to minimize symptoms.  He has been placed on Zoloft since this past January for  anxiety and this is not improved his symptoms at all   CT angio 03/27/2019 - normal  EGD 04/15/2019 - LA Grade esophagitis was found 40 cm from the incisors. - A single nodule was found at the gastroesophageal junction at the site of esophagitis, 40 cm from the incisors, suspect inflammatory in etiology. Biopsies were taken with a cold forceps for histology. - A small hiatal hernia was present. - The exam of the esophagus was otherwise normal. - A single benign appearing 3 mm sessile polyp was found in the gastric fundus. The polyp was removed with a cold biopsy forceps. Resection and retrieval were complete. - The proximal stomach was angulated leading into the gastric body, making it challenging to retroflex in this area. The exam of the stomach was otherwise normal - no inflammatory changes, ulcerations, etc. - Biopsies were taken with a cold forceps in the gastric body, at the incisura and in the gastric antrum for Helicobacter pylori testing. - The duodenal bulb and second portion of the duodenum were normal. Biopsies for histology were taken with a cold forceps for evaluation of celiac disease.    Past Medical History:  Diagnosis Date  . ADHD   . Anxiety   . Near syncope   . PSVT (paroxysmal supraventricular tachycardia) (HCC)      Past Surgical History:  Procedure Laterality Date  . NO PAST SURGERIES     Family History  Problem Relation Age of Onset  . Heart attack Maternal Grandfather   . Colon polyps Neg Hx   . Esophageal cancer Neg Hx   . Rectal cancer Neg Hx   . Stomach cancer Neg Hx    Social History   Tobacco Use  . Smoking status: Never Smoker  . Smokeless tobacco: Never Used  . Tobacco comment: marijuana  Substance Use Topics  . Alcohol use: Not Currently  . Drug use: Not Currently   Current Outpatient Medications  Medication Sig Dispense Refill  . MYDAYIS 37.5 MG CP24 daily.    Marland Kitchen omeprazole (PRILOSEC) 20 MG capsule Take 20 mg by mouth daily.    .  ondansetron (ZOFRAN) 4 MG tablet Take one tablet 30 minutes prior to exercise. 30 tablet 3  . sertraline (ZOLOFT) 100 MG tablet Take 100 mg by mouth daily.     No current facility-administered medications for this visit.   No Known Allergies   Review of Systems: All systems reviewed and negative except where noted in HPI.    No results found.  Physical Exam: BP 100/60   Pulse 76   Temp 98.2 F (36.8 C)   Ht 6\' 2"  (1.88 m)   Wt 163 lb 12.8 oz (74.3 kg)   BMI 21.03 kg/m  Constitutional: Pleasant,well-developed, male in no acute distress. HEENT: Normocephalic and atraumatic. Conjunctivae are normal. No scleral icterus. Neck supple.  Cardiovascular: Normal rate, regular rhythm.  Pulmonary/chest: Effort normal and breath sounds normal. No wheezing, rales or rhonchi. Abdominal: Soft, nondistended, nontender. . There are no masses palpable. No bruits Extremities: no edema Lymphadenopathy: No cervical adenopathy noted. Neurological: Alert and oriented to person place and time. Skin: Skin is warm and dry. No rashes noted. Psychiatric: Normal mood and affect. Behavior is normal.   ASSESSMENT AND PLAN: 19 year old male here for reassessment the following issues:  Exertional nausea - this is a known phenomena with severe exertional activitity however he has severe nausea with abdominal pain after minimal exertion.  Again symptoms do not bother him if he is not exerting himself and he otherwise eats well.  CT scan and EGD did not show too much other than some mild reflux esophagitis.  The symptoms do not seem consistent with typical reflux but I like to try him on a higher dose PPI to see if that helps at all given findings on endoscopy.  He will increase his omeprazole to 40 mg twice a day for 1 month as a trial to see if there is any improvement.  Otherwise other diagnoses to consider given the exertional component to this would be median arcuate ligament syndrome.  This was not noted on  his CT scan however could consider inspiratory/expiratory vascular imaging to further assess for this.  I will discuss his case with radiology to see if there is any imaging they think would be helpful in further evaluating for that.  I will get back to him after I have spoken with radiology.  If symptoms persist despite these measures and were not making any progress, I may refer him to [facility] for second opinion.  He agreed with the plan, all questions answered, will follow up with him with further recommendations.   GERD - as above, trial of higher dose PPI  I spent 35 minutes of time, including in depth chart review, independent review of results as outlined above, communicating results with the patient directly, face-to-face time with the patient, coordinating care, and ordering studies and medications as appropriate,  and documenting this encounter.  Ileene Patrick, MD [facility]

## 2019-05-22 ENCOUNTER — Telehealth: Payer: Self-pay | Admitting: Gastroenterology

## 2019-05-22 NOTE — Telephone Encounter (Signed)
Called radiology and reviewed CTA with them. No evidence of celiac impingement but median arcuate ligament syndrome remains on the ddx given his symptoms. They do have capability to perform mesenteric artery [place] with inspiratory / expiratory phases to further evaluate for median arcuate ligament syndrome to further evaluate for this.    [name], can you help coordinate this specific type of [place] as outlined below for this patient if he wishes to proceed? I would like him to continue omeprazole 40mg  BID in the interim to see if that helps. Can you let me know? Thanks

## 2019-05-26 ENCOUNTER — Other Ambulatory Visit: Payer: Self-pay

## 2019-05-26 DIAGNOSIS — R109 Unspecified abdominal pain: Secondary | ICD-10-CM

## 2019-05-26 NOTE — Telephone Encounter (Signed)
Scheduled US-mesenteric artery w/inspiratory/expiratory phases @ [facility] on 06/03/19. [facility] To be on clear liquids after 1200 (noon) the day before, and NPO after midnight. To not urinate after 7:00am the morning of the [place]. Patient states he has not picked-up his Omeprazole yet. But he will  Get it  and take it BID

## 2019-06-03 ENCOUNTER — Inpatient Hospital Stay: Admission: RE | Admit: 2019-06-03 | Payer: BC Managed Care – PPO | Source: Ambulatory Visit

## 2019-06-06 ENCOUNTER — Ambulatory Visit
Admission: RE | Admit: 2019-06-06 | Discharge: 2019-06-06 | Disposition: A | Discharge status: Home / Self Care | Payer: BC Managed Care – PPO | Source: Ambulatory Visit | Attending: Gastroenterology | Admitting: Gastroenterology

## 2019-06-06 DIAGNOSIS — R109 Unspecified abdominal pain: Secondary | ICD-10-CM

## 2019-08-22 ENCOUNTER — Other Ambulatory Visit: Payer: Self-pay

## 2019-08-22 MED ORDER — OMEPRAZOLE 40 MG PO CPDR: 40.0000 mg | DELAYED_RELEASE_CAPSULE | Freq: Two times a day (BID) | ORAL | 3 refills | Status: DC

## 2019-12-22 NOTE — Telephone Encounter (Signed)
Referral and records faxed to [facility]

## 2020-03-15 ENCOUNTER — Other Ambulatory Visit: Payer: Self-pay

## 2020-03-15 ENCOUNTER — Telehealth: Payer: Self-pay

## 2020-03-15 MED ORDER — OMEPRAZOLE 40 MG PO CPDR: 40.0000 mg | DELAYED_RELEASE_CAPSULE | Freq: Two times a day (BID) | ORAL | 3 refills | Status: DC

## 2020-03-15 NOTE — Telephone Encounter (Signed)
Called patient and he just wanted his omeprazole refilled, not changed. I sent order to his pharmacy. Let him know the referral has been made to [facility] but it was a couple of months ago. Suggested he call back on Wed and speak to [place] to see if she has any update on the status.

## 2021-01-16 IMAGING — CT CT CTA ABD/PEL W/CM AND/OR W/O CM
2 of 5 series · 15 of 46 positions shown, 17 images · IV contrast (OMNIPAQUE)
Comparison: None.

CLINICAL DATA: Exertional nausea with abdominal pain, concern for
mesenteric vascular disease

EXAM:
CT ANGIOGRAPHY ABDOMEN AND PELVIS WITH CONTRAST AND WITHOUT CONTRAST
TECHNIQUE: Multidetector CT imaging of the abdomen and pelvis was performed
using the standard protocol during bolus administration of
intravenous contrast. Multiplanar reconstructed images and MIPs were
obtained and reviewed to evaluate the vascular anatomy.
CONTRAST:  100mL OMNIPAQUE IOHEXOL 350 MG/ML SOLN

[Series 4: axial arterial · axial · arterial · 0.64mm/px · z∈[-507,-75]mm · 12 of 250 slices shown, 14 images]
[im 17/250  soft-tissue]
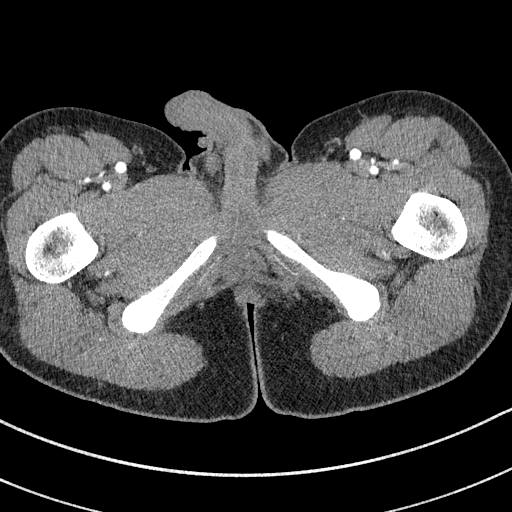
[im 17/250  bone]
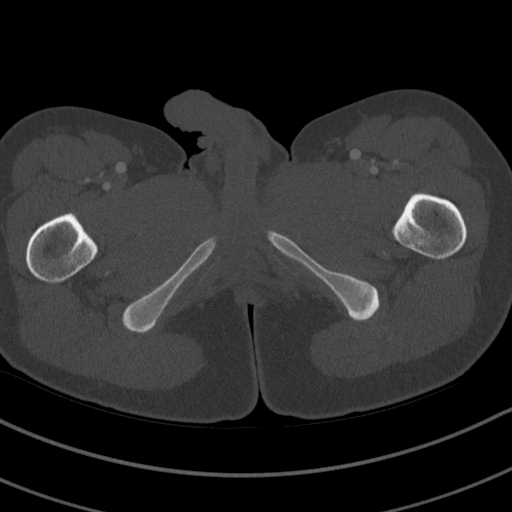
[im 33/250  soft-tissue]
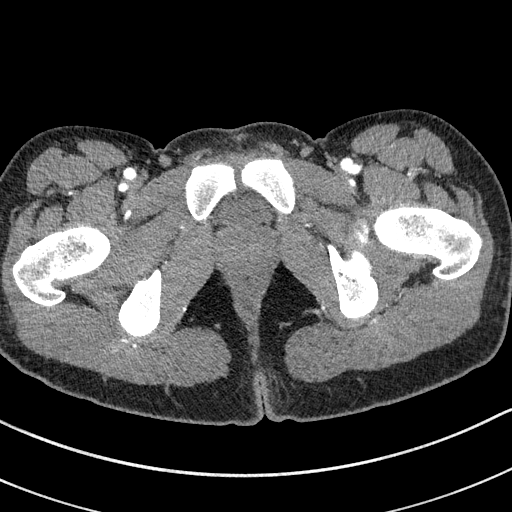
[im 57/250  soft-tissue]
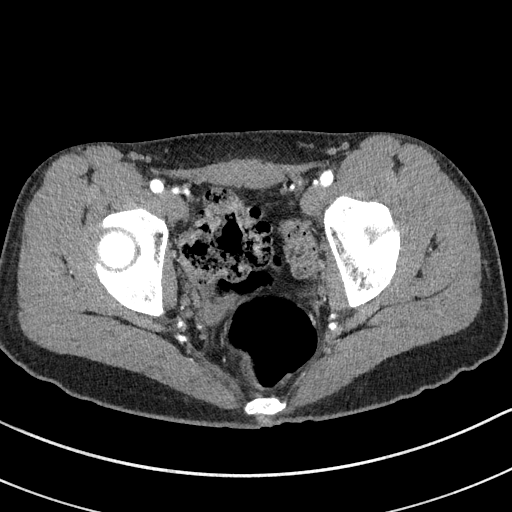
[im 73/250  soft-tissue]
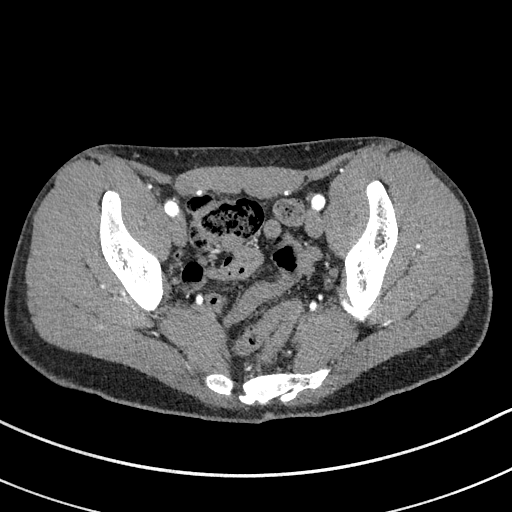
[im 97/250  soft-tissue]
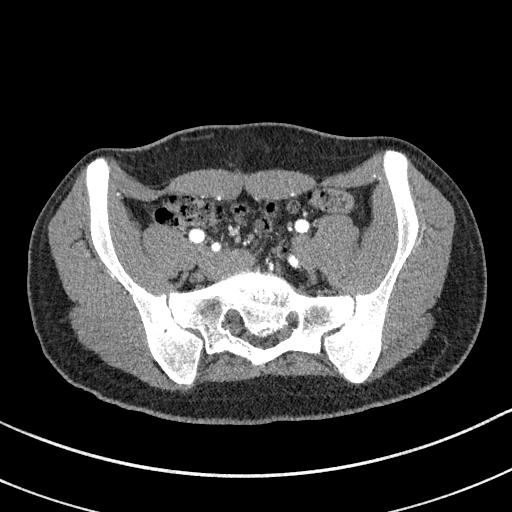
[im 113/250  soft-tissue]
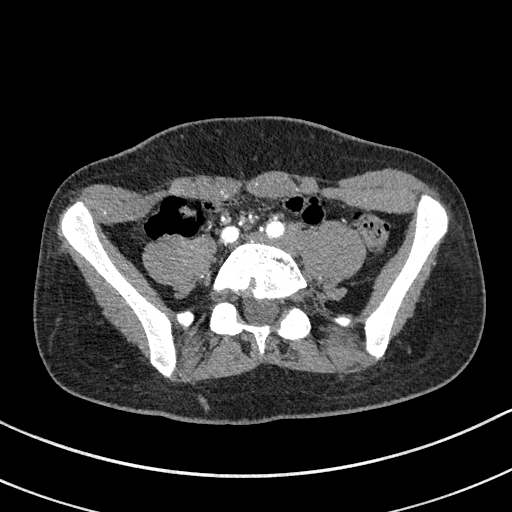
[im 137/250  soft-tissue]
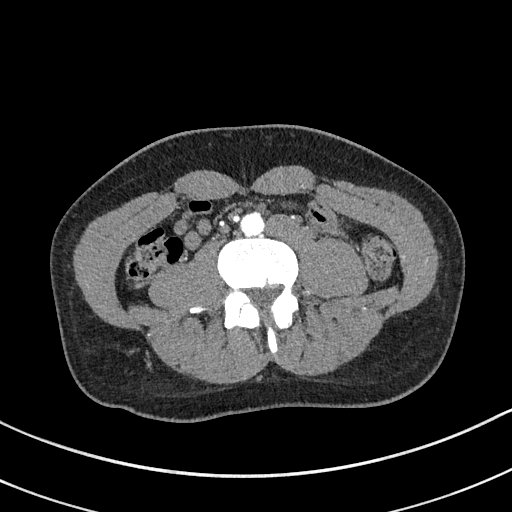
[im 153/250  soft-tissue]
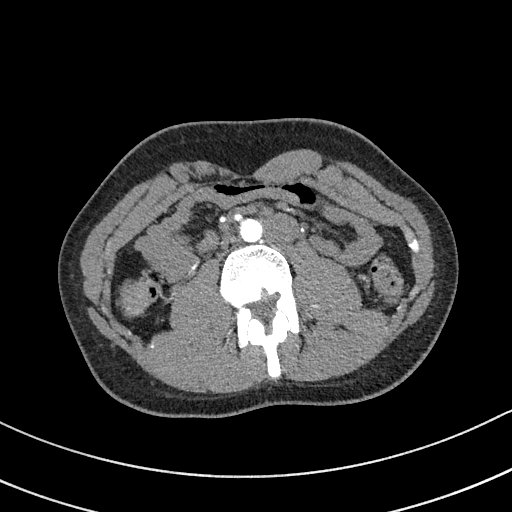
[im 177/250  soft-tissue]
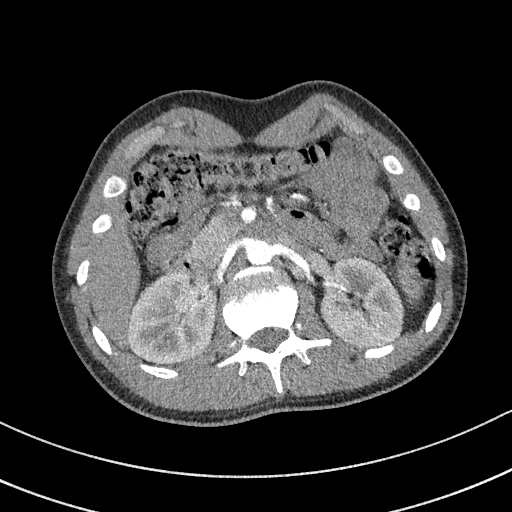
[im 177/250  bone]
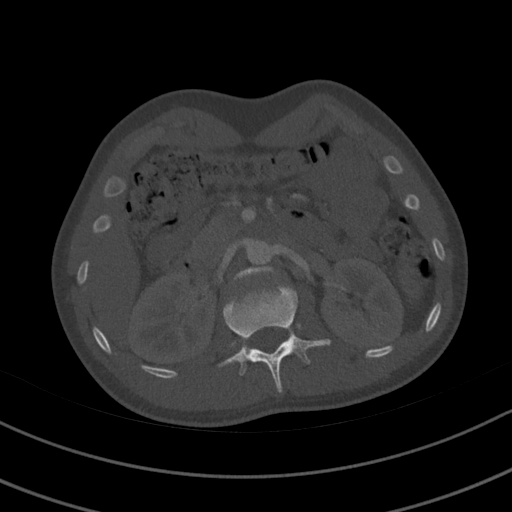
[im 193/250  soft-tissue]
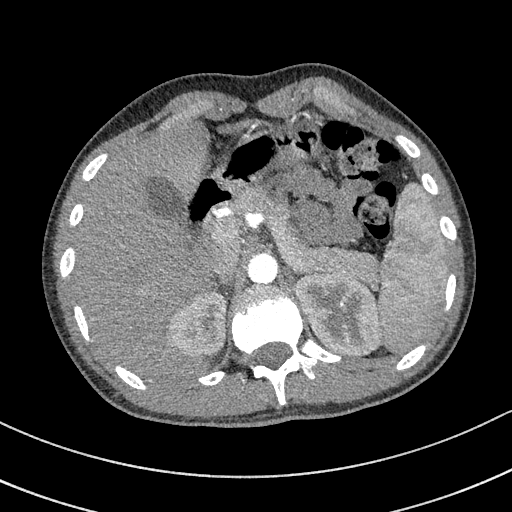
[im 217/250  soft-tissue]
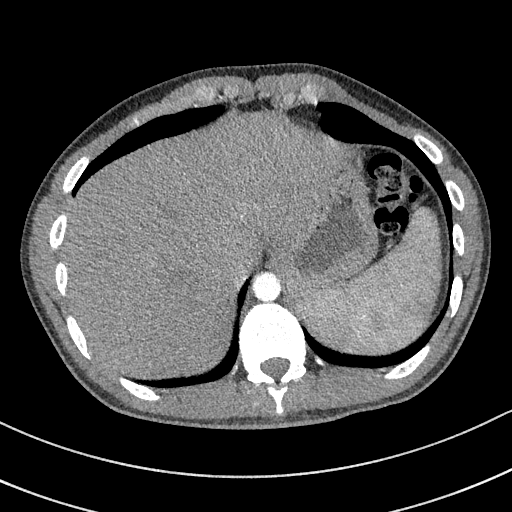
[im 233/250  soft-tissue]
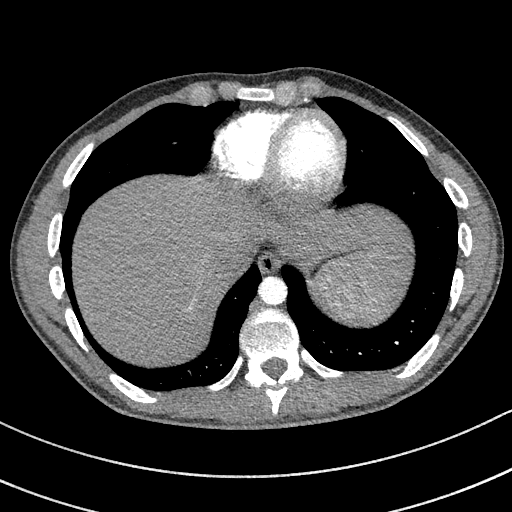

[Series 7: coronal mpr · coronal · 0.67mm/px · 3 of 123 slices shown]
[im 25/123  soft-tissue]
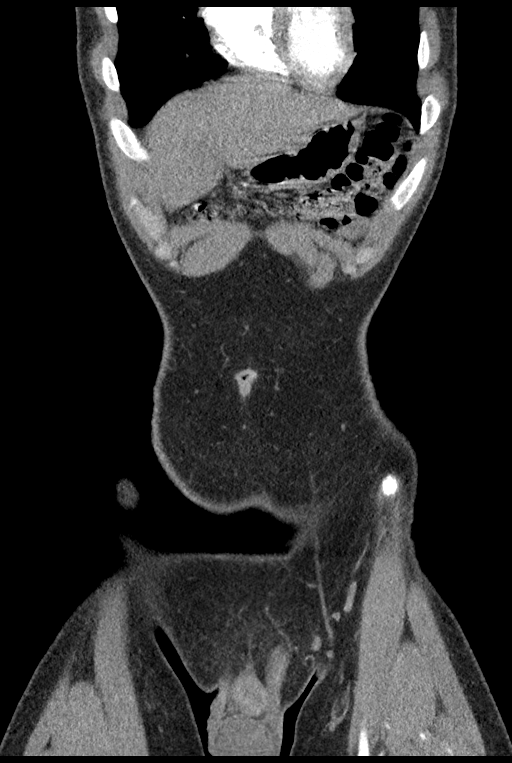
[im 49/123  soft-tissue]
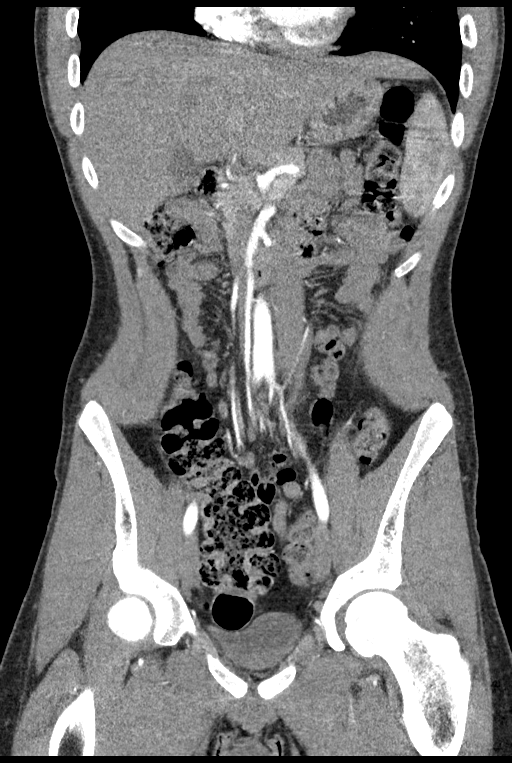
[im 74/123  soft-tissue]
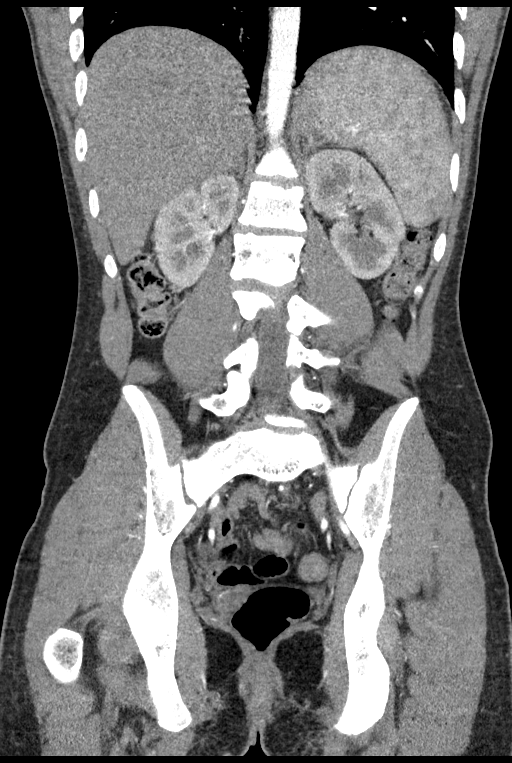

[15 of 46 positions shown; findings below may reference images not displayed]

FINDINGS: VASCULAR

Aorta: Intact aorta normal caliber. No irregularity or early
atherosclerotic disease. Negative for dissection or aneurysm. No
retroperitoneal hemorrhage hematoma.

Celiac: Widely patent including its branches

SMA: Widely patent including its branches

Renals: Widely patent

IMA: Remains patent off the distal aorta including its branches

Inflow: Common, internal and external iliac arteries are widely
patent. No occlusive process, acute vascular finding, or inflow
disease.

Proximal Outflow: Widely patent

Veins: Dedicated venous imaging not performed. However, the patient
does appear to have a left-sided infrarenal IVC, normal variant

Review of the MIP images confirms the above findings.

NON-VASCULAR

Lower chest: No acute abnormality.

Hepatobiliary: Limited arterial phase imaging only. No large focal
hepatic abnormality. Gallbladder and biliary system unremarkable.

Pancreas: Unremarkable. No pancreatic ductal dilatation or
surrounding inflammatory changes.

Spleen: Normal in size without focal abnormality.

Adrenals/Urinary Tract: Adrenal glands are unremarkable. Kidneys are
normal, without renal calculi, focal lesion, or hydronephrosis.
Bladder is unremarkable.

Stomach/Bowel: Stomach is within normal limits. Appendix appears
normal. No evidence of bowel wall thickening, distention, or
inflammatory changes.

Lymphatic: No bulky adenopathy.

Reproductive: No significant finding by CT.

Other: No abdominal wall hernia or abnormality. No abdominopelvic
ascites.

Musculoskeletal: No acute osseous finding.
IMPRESSION: VASCULAR

Widely patent mesenteric vasculature. No significant acute vascular
process or occlusive disease.

NON-VASCULAR

Left-sided infrarenal IVC, normal variant.

No other acute intra-abdominal or pelvic finding by CTA.

## 2021-01-24 ENCOUNTER — Other Ambulatory Visit: Payer: Self-pay

## 2021-01-24 MED ORDER — OMEPRAZOLE 40 MG PO CPDR: 40.0000 mg | DELAYED_RELEASE_CAPSULE | Freq: Two times a day (BID) | ORAL | 0 refills | Status: AC

## 2021-01-24 NOTE — Progress Notes (Signed)
Refill request rec'd via fax. Patient last seen 05-2019. Needs an office visit
